# Patient Record
Sex: Female | Born: 1945 | Race: Black or African American | Hispanic: No | Marital: Married
Health system: Southern US, Community
[De-identification: ages and names within clinical notes are randomized; demographics above are authoritative.]

---

## 2012-06-03 ENCOUNTER — Ambulatory Visit: Payer: Self-pay | Admitting: Internal Medicine

## 2012-06-21 ENCOUNTER — Ambulatory Visit: Payer: Self-pay | Admitting: Internal Medicine

## 2012-06-22 ENCOUNTER — Ambulatory Visit: Payer: Self-pay | Admitting: Internal Medicine

## 2012-07-23 ENCOUNTER — Ambulatory Visit: Payer: Self-pay | Admitting: Internal Medicine

## 2013-06-25 ENCOUNTER — Emergency Department: Payer: Self-pay | Admitting: Internal Medicine

## 2015-03-22 ENCOUNTER — Other Ambulatory Visit: Payer: Self-pay | Admitting: Internal Medicine

## 2015-03-22 DIAGNOSIS — Z1231 Encounter for screening mammogram for malignant neoplasm of breast: Secondary | ICD-10-CM

## 2015-04-02 ENCOUNTER — Ambulatory Visit: Payer: Self-pay | Attending: Internal Medicine

## 2015-04-18 ENCOUNTER — Other Ambulatory Visit: Payer: Self-pay | Admitting: Internal Medicine

## 2015-04-18 ENCOUNTER — Ambulatory Visit
Admission: RE | Admit: 2015-04-18 | Discharge: 2015-04-18 | Disposition: A | Discharge status: Home / Self Care | Payer: Medicare Other | Source: Ambulatory Visit | Attending: Internal Medicine | Admitting: Internal Medicine

## 2015-04-18 DIAGNOSIS — Z1231 Encounter for screening mammogram for malignant neoplasm of breast: Secondary | ICD-10-CM | POA: Diagnosis not present

## 2016-05-18 ENCOUNTER — Other Ambulatory Visit: Payer: Self-pay | Admitting: Internal Medicine

## 2016-05-18 DIAGNOSIS — Z1231 Encounter for screening mammogram for malignant neoplasm of breast: Secondary | ICD-10-CM

## 2016-05-28 ENCOUNTER — Ambulatory Visit
Admission: RE | Admit: 2016-05-28 | Discharge: 2016-05-28 | Disposition: A | Discharge status: Home / Self Care | Payer: Medicare Other | Source: Ambulatory Visit | Attending: Internal Medicine | Admitting: Internal Medicine

## 2016-05-28 DIAGNOSIS — Z1231 Encounter for screening mammogram for malignant neoplasm of breast: Secondary | ICD-10-CM | POA: Insufficient documentation

## 2017-05-12 ENCOUNTER — Other Ambulatory Visit: Payer: Self-pay | Admitting: Internal Medicine

## 2017-05-12 DIAGNOSIS — Z1231 Encounter for screening mammogram for malignant neoplasm of breast: Secondary | ICD-10-CM

## 2017-06-04 ENCOUNTER — Ambulatory Visit
Admission: RE | Admit: 2017-06-04 | Discharge: 2017-06-04 | Disposition: A | Discharge status: Home / Self Care | Payer: Medicare HMO | Source: Ambulatory Visit | Attending: Internal Medicine | Admitting: Internal Medicine

## 2017-06-04 DIAGNOSIS — Z1231 Encounter for screening mammogram for malignant neoplasm of breast: Secondary | ICD-10-CM | POA: Diagnosis not present

## 2018-05-04 ENCOUNTER — Other Ambulatory Visit: Payer: Self-pay | Admitting: Internal Medicine

## 2018-05-09 ENCOUNTER — Other Ambulatory Visit: Payer: Self-pay | Admitting: Internal Medicine

## 2018-05-09 DIAGNOSIS — Z1231 Encounter for screening mammogram for malignant neoplasm of breast: Secondary | ICD-10-CM

## 2018-06-06 ENCOUNTER — Ambulatory Visit
Admission: RE | Admit: 2018-06-06 | Discharge: 2018-06-06 | Disposition: A | Discharge status: Home / Self Care | Payer: Medicare HMO | Source: Ambulatory Visit | Attending: Internal Medicine | Admitting: Internal Medicine

## 2018-06-06 DIAGNOSIS — Z1231 Encounter for screening mammogram for malignant neoplasm of breast: Secondary | ICD-10-CM | POA: Diagnosis present

## 2019-05-10 ENCOUNTER — Other Ambulatory Visit: Payer: Self-pay | Admitting: Internal Medicine

## 2019-05-10 DIAGNOSIS — Z1231 Encounter for screening mammogram for malignant neoplasm of breast: Secondary | ICD-10-CM

## 2019-06-08 ENCOUNTER — Ambulatory Visit
Admission: RE | Admit: 2019-06-08 | Discharge: 2019-06-08 | Disposition: A | Discharge status: Home / Self Care | Payer: Medicare HMO | Source: Ambulatory Visit | Attending: Internal Medicine | Admitting: Internal Medicine

## 2019-06-08 DIAGNOSIS — Z1231 Encounter for screening mammogram for malignant neoplasm of breast: Secondary | ICD-10-CM | POA: Insufficient documentation

## 2019-08-04 ENCOUNTER — Ambulatory Visit: Payer: Medicare HMO | Attending: Internal Medicine

## 2019-08-04 DIAGNOSIS — Z23 Encounter for immunization: Secondary | ICD-10-CM

## 2019-08-04 NOTE — Progress Notes (Signed)
   Covid-19 Vaccination Clinic  Name:  SIBEL KHURANA    MRN: [id number] DOB: [birth year]  08/04/2019  Ms. Pavia was observed post Covid-19 immunization for 15 minutes without incidence. She was provided with Vaccine Information Sheet and instruction to access the V-Safe system.   Ms. Yerby was instructed to call 911 with any severe reactions post vaccine: Marland Kitchen Difficulty breathing  . Swelling of your face and throat  . A fast heartbeat  . A bad rash all over your body  . Dizziness and weakness    Immunizations Administered    Name Date Dose VIS Date Route   Pfizer COVID-19 Vaccine 08/04/2019 11:43 AM 0.3 mL 06/02/2019 Intramuscular   Manufacturer: [employer], [employer]   Lot: [id number]   NDC: [address]-3

## 2019-08-29 ENCOUNTER — Ambulatory Visit: Payer: Medicare HMO | Attending: Internal Medicine

## 2019-08-29 DIAGNOSIS — Z23 Encounter for immunization: Secondary | ICD-10-CM | POA: Insufficient documentation

## 2019-08-29 NOTE — Progress Notes (Signed)
   Covid-19 Vaccination Clinic  Name:  ALMIRA PHETTEPLACE    MRN: [id number] DOB: 07/25/1945  08/29/2019  Ms. Hams was observed post Covid-19 immunization for 15 minutes without incident. She was provided with Vaccine Information Sheet and instruction to access the V-Safe system.   Ms. Dunton was instructed to call 911 with any severe reactions post vaccine: Marland Kitchen Difficulty breathing  . Swelling of face and throat  . A fast heartbeat  . A bad rash all over body  . Dizziness and weakness   Immunizations Administered    Name Date Dose VIS Date Route   Pfizer COVID-19 Vaccine 08/29/2019  2:33 PM 0.3 mL 06/02/2019 Intramuscular   Manufacturer: [employer], [employer]   Lot: [id number]   NDC: [address]-8

## 2020-05-22 ENCOUNTER — Other Ambulatory Visit: Payer: Self-pay | Admitting: Internal Medicine

## 2020-05-22 DIAGNOSIS — Z1231 Encounter for screening mammogram for malignant neoplasm of breast: Secondary | ICD-10-CM

## 2020-06-24 DIAGNOSIS — N189 Chronic kidney disease, unspecified: Secondary | ICD-10-CM | POA: Diagnosis not present

## 2020-06-24 DIAGNOSIS — Z0001 Encounter for general adult medical examination with abnormal findings: Secondary | ICD-10-CM | POA: Diagnosis not present

## 2020-06-24 DIAGNOSIS — Z739 Problem related to life management difficulty, unspecified: Secondary | ICD-10-CM | POA: Diagnosis not present

## 2020-06-24 DIAGNOSIS — E785 Hyperlipidemia, unspecified: Secondary | ICD-10-CM | POA: Diagnosis not present

## 2020-06-24 DIAGNOSIS — M158 Other polyosteoarthritis: Secondary | ICD-10-CM | POA: Diagnosis not present

## 2020-06-24 DIAGNOSIS — G4709 Other insomnia: Secondary | ICD-10-CM | POA: Diagnosis not present

## 2020-06-24 DIAGNOSIS — Z1389 Encounter for screening for other disorder: Secondary | ICD-10-CM | POA: Diagnosis not present

## 2020-06-24 DIAGNOSIS — E118 Type 2 diabetes mellitus with unspecified complications: Secondary | ICD-10-CM | POA: Diagnosis not present

## 2020-06-24 DIAGNOSIS — I10 Essential (primary) hypertension: Secondary | ICD-10-CM | POA: Diagnosis not present

## 2020-06-24 DIAGNOSIS — B354 Tinea corporis: Secondary | ICD-10-CM | POA: Diagnosis not present

## 2020-06-24 DIAGNOSIS — R69 Illness, unspecified: Secondary | ICD-10-CM | POA: Diagnosis not present

## 2020-06-24 DIAGNOSIS — Z1331 Encounter for screening for depression: Secondary | ICD-10-CM | POA: Diagnosis not present

## 2020-06-24 DIAGNOSIS — F341 Dysthymic disorder: Secondary | ICD-10-CM | POA: Diagnosis not present

## 2020-08-13 DIAGNOSIS — E119 Type 2 diabetes mellitus without complications: Secondary | ICD-10-CM | POA: Diagnosis not present

## 2020-10-07 DIAGNOSIS — J0100 Acute maxillary sinusitis, unspecified: Secondary | ICD-10-CM | POA: Diagnosis not present

## 2020-10-07 DIAGNOSIS — N189 Chronic kidney disease, unspecified: Secondary | ICD-10-CM | POA: Diagnosis not present

## 2020-10-07 DIAGNOSIS — E118 Type 2 diabetes mellitus with unspecified complications: Secondary | ICD-10-CM | POA: Diagnosis not present

## 2020-10-07 DIAGNOSIS — M158 Other polyosteoarthritis: Secondary | ICD-10-CM | POA: Diagnosis not present

## 2020-10-07 DIAGNOSIS — E668 Other obesity: Secondary | ICD-10-CM | POA: Diagnosis not present

## 2020-10-07 DIAGNOSIS — I10 Essential (primary) hypertension: Secondary | ICD-10-CM | POA: Diagnosis not present

## 2020-10-07 DIAGNOSIS — E785 Hyperlipidemia, unspecified: Secondary | ICD-10-CM | POA: Diagnosis not present

## 2020-10-07 DIAGNOSIS — B354 Tinea corporis: Secondary | ICD-10-CM | POA: Diagnosis not present

## 2020-10-07 DIAGNOSIS — R69 Illness, unspecified: Secondary | ICD-10-CM | POA: Diagnosis not present

## 2020-10-07 DIAGNOSIS — G4709 Other insomnia: Secondary | ICD-10-CM | POA: Diagnosis not present

## 2021-03-06 DIAGNOSIS — E118 Type 2 diabetes mellitus with unspecified complications: Secondary | ICD-10-CM | POA: Diagnosis not present

## 2021-03-06 DIAGNOSIS — E785 Hyperlipidemia, unspecified: Secondary | ICD-10-CM | POA: Diagnosis not present

## 2021-03-06 DIAGNOSIS — I10 Essential (primary) hypertension: Secondary | ICD-10-CM | POA: Diagnosis not present

## 2021-03-07 DIAGNOSIS — N189 Chronic kidney disease, unspecified: Secondary | ICD-10-CM | POA: Diagnosis not present

## 2021-03-07 DIAGNOSIS — E785 Hyperlipidemia, unspecified: Secondary | ICD-10-CM | POA: Diagnosis not present

## 2021-03-07 DIAGNOSIS — G4709 Other insomnia: Secondary | ICD-10-CM | POA: Diagnosis not present

## 2021-03-07 DIAGNOSIS — R69 Illness, unspecified: Secondary | ICD-10-CM | POA: Diagnosis not present

## 2021-03-07 DIAGNOSIS — I10 Essential (primary) hypertension: Secondary | ICD-10-CM | POA: Diagnosis not present

## 2021-03-07 DIAGNOSIS — M158 Other polyosteoarthritis: Secondary | ICD-10-CM | POA: Diagnosis not present

## 2021-03-07 DIAGNOSIS — B354 Tinea corporis: Secondary | ICD-10-CM | POA: Diagnosis not present

## 2021-03-07 DIAGNOSIS — E668 Other obesity: Secondary | ICD-10-CM | POA: Diagnosis not present

## 2021-03-07 DIAGNOSIS — E118 Type 2 diabetes mellitus with unspecified complications: Secondary | ICD-10-CM | POA: Diagnosis not present

## 2021-04-04 DIAGNOSIS — Z23 Encounter for immunization: Secondary | ICD-10-CM | POA: Diagnosis not present

## 2021-05-12 ENCOUNTER — Other Ambulatory Visit: Payer: Self-pay | Admitting: Internal Medicine

## 2021-05-12 DIAGNOSIS — Z1231 Encounter for screening mammogram for malignant neoplasm of breast: Secondary | ICD-10-CM

## 2021-06-03 DIAGNOSIS — E118 Type 2 diabetes mellitus with unspecified complications: Secondary | ICD-10-CM | POA: Diagnosis not present

## 2021-06-03 DIAGNOSIS — N189 Chronic kidney disease, unspecified: Secondary | ICD-10-CM | POA: Diagnosis not present

## 2021-06-06 DIAGNOSIS — B354 Tinea corporis: Secondary | ICD-10-CM | POA: Diagnosis not present

## 2021-06-06 DIAGNOSIS — E118 Type 2 diabetes mellitus with unspecified complications: Secondary | ICD-10-CM | POA: Diagnosis not present

## 2021-06-06 DIAGNOSIS — I10 Essential (primary) hypertension: Secondary | ICD-10-CM | POA: Diagnosis not present

## 2021-06-06 DIAGNOSIS — M158 Other polyosteoarthritis: Secondary | ICD-10-CM | POA: Diagnosis not present

## 2021-06-06 DIAGNOSIS — J0100 Acute maxillary sinusitis, unspecified: Secondary | ICD-10-CM | POA: Diagnosis not present

## 2021-06-06 DIAGNOSIS — E668 Other obesity: Secondary | ICD-10-CM | POA: Diagnosis not present

## 2021-06-06 DIAGNOSIS — N189 Chronic kidney disease, unspecified: Secondary | ICD-10-CM | POA: Diagnosis not present

## 2021-06-06 DIAGNOSIS — G4709 Other insomnia: Secondary | ICD-10-CM | POA: Diagnosis not present

## 2021-06-06 DIAGNOSIS — R69 Illness, unspecified: Secondary | ICD-10-CM | POA: Diagnosis not present

## 2021-06-06 DIAGNOSIS — E785 Hyperlipidemia, unspecified: Secondary | ICD-10-CM | POA: Diagnosis not present

## 2021-09-03 DIAGNOSIS — E118 Type 2 diabetes mellitus with unspecified complications: Secondary | ICD-10-CM | POA: Diagnosis not present

## 2021-09-03 DIAGNOSIS — I10 Essential (primary) hypertension: Secondary | ICD-10-CM | POA: Diagnosis not present

## 2021-09-05 DIAGNOSIS — Z1331 Encounter for screening for depression: Secondary | ICD-10-CM | POA: Diagnosis not present

## 2021-09-05 DIAGNOSIS — E118 Type 2 diabetes mellitus with unspecified complications: Secondary | ICD-10-CM | POA: Diagnosis not present

## 2021-09-05 DIAGNOSIS — M158 Other polyosteoarthritis: Secondary | ICD-10-CM | POA: Diagnosis not present

## 2021-09-05 DIAGNOSIS — N189 Chronic kidney disease, unspecified: Secondary | ICD-10-CM | POA: Diagnosis not present

## 2021-09-05 DIAGNOSIS — F341 Dysthymic disorder: Secondary | ICD-10-CM | POA: Diagnosis not present

## 2021-09-05 DIAGNOSIS — E785 Hyperlipidemia, unspecified: Secondary | ICD-10-CM | POA: Diagnosis not present

## 2021-09-05 DIAGNOSIS — Z0001 Encounter for general adult medical examination with abnormal findings: Secondary | ICD-10-CM | POA: Diagnosis not present

## 2021-09-05 DIAGNOSIS — R69 Illness, unspecified: Secondary | ICD-10-CM | POA: Diagnosis not present

## 2021-09-05 DIAGNOSIS — B354 Tinea corporis: Secondary | ICD-10-CM | POA: Diagnosis not present

## 2021-09-05 DIAGNOSIS — I10 Essential (primary) hypertension: Secondary | ICD-10-CM | POA: Diagnosis not present

## 2021-09-05 DIAGNOSIS — G4709 Other insomnia: Secondary | ICD-10-CM | POA: Diagnosis not present

## 2021-09-05 DIAGNOSIS — E668 Other obesity: Secondary | ICD-10-CM | POA: Diagnosis not present

## 2021-10-31 DIAGNOSIS — E119 Type 2 diabetes mellitus without complications: Secondary | ICD-10-CM | POA: Diagnosis not present

## 2021-10-31 DIAGNOSIS — H35341 Macular cyst, hole, or pseudohole, right eye: Secondary | ICD-10-CM | POA: Diagnosis not present

## 2021-10-31 DIAGNOSIS — H35371 Puckering of macula, right eye: Secondary | ICD-10-CM | POA: Diagnosis not present

## 2021-10-31 DIAGNOSIS — H43813 Vitreous degeneration, bilateral: Secondary | ICD-10-CM | POA: Diagnosis not present

## 2021-11-14 ENCOUNTER — Other Ambulatory Visit: Payer: Self-pay | Admitting: Internal Medicine

## 2021-11-14 DIAGNOSIS — Z1231 Encounter for screening mammogram for malignant neoplasm of breast: Secondary | ICD-10-CM

## 2021-11-28 ENCOUNTER — Ambulatory Visit
Admission: RE | Admit: 2021-11-28 | Discharge: 2021-11-28 | Disposition: A | Discharge status: Home / Self Care | Payer: Medicare HMO | Source: Ambulatory Visit | Attending: Internal Medicine | Admitting: Internal Medicine

## 2021-11-28 DIAGNOSIS — Z1231 Encounter for screening mammogram for malignant neoplasm of breast: Secondary | ICD-10-CM | POA: Insufficient documentation

## 2021-12-01 DIAGNOSIS — H5213 Myopia, bilateral: Secondary | ICD-10-CM | POA: Diagnosis not present

## 2021-12-09 DIAGNOSIS — E118 Type 2 diabetes mellitus with unspecified complications: Secondary | ICD-10-CM | POA: Diagnosis not present

## 2021-12-09 DIAGNOSIS — N189 Chronic kidney disease, unspecified: Secondary | ICD-10-CM | POA: Diagnosis not present

## 2021-12-12 DIAGNOSIS — G4709 Other insomnia: Secondary | ICD-10-CM | POA: Diagnosis not present

## 2021-12-12 DIAGNOSIS — N189 Chronic kidney disease, unspecified: Secondary | ICD-10-CM | POA: Diagnosis not present

## 2021-12-12 DIAGNOSIS — E118 Type 2 diabetes mellitus with unspecified complications: Secondary | ICD-10-CM | POA: Diagnosis not present

## 2021-12-12 DIAGNOSIS — E668 Other obesity: Secondary | ICD-10-CM | POA: Diagnosis not present

## 2021-12-12 DIAGNOSIS — E785 Hyperlipidemia, unspecified: Secondary | ICD-10-CM | POA: Diagnosis not present

## 2021-12-12 DIAGNOSIS — B354 Tinea corporis: Secondary | ICD-10-CM | POA: Diagnosis not present

## 2021-12-12 DIAGNOSIS — I10 Essential (primary) hypertension: Secondary | ICD-10-CM | POA: Diagnosis not present

## 2021-12-12 DIAGNOSIS — M158 Other polyosteoarthritis: Secondary | ICD-10-CM | POA: Diagnosis not present

## 2021-12-12 DIAGNOSIS — R69 Illness, unspecified: Secondary | ICD-10-CM | POA: Diagnosis not present

## 2021-12-15 DIAGNOSIS — H6123 Impacted cerumen, bilateral: Secondary | ICD-10-CM | POA: Diagnosis not present

## 2021-12-15 DIAGNOSIS — H903 Sensorineural hearing loss, bilateral: Secondary | ICD-10-CM | POA: Diagnosis not present

## 2022-01-12 DIAGNOSIS — K08409 Partial loss of teeth, unspecified cause, unspecified class: Secondary | ICD-10-CM | POA: Diagnosis not present

## 2022-01-12 DIAGNOSIS — E785 Hyperlipidemia, unspecified: Secondary | ICD-10-CM | POA: Diagnosis not present

## 2022-01-12 DIAGNOSIS — N182 Chronic kidney disease, stage 2 (mild): Secondary | ICD-10-CM | POA: Diagnosis not present

## 2022-01-12 DIAGNOSIS — E1122 Type 2 diabetes mellitus with diabetic chronic kidney disease: Secondary | ICD-10-CM | POA: Diagnosis not present

## 2022-01-12 DIAGNOSIS — K219 Gastro-esophageal reflux disease without esophagitis: Secondary | ICD-10-CM | POA: Diagnosis not present

## 2022-01-12 DIAGNOSIS — E669 Obesity, unspecified: Secondary | ICD-10-CM | POA: Diagnosis not present

## 2022-01-12 DIAGNOSIS — I10 Essential (primary) hypertension: Secondary | ICD-10-CM | POA: Diagnosis not present

## 2022-01-12 DIAGNOSIS — R69 Illness, unspecified: Secondary | ICD-10-CM | POA: Diagnosis not present

## 2022-01-12 DIAGNOSIS — F419 Anxiety disorder, unspecified: Secondary | ICD-10-CM | POA: Diagnosis not present

## 2022-01-12 DIAGNOSIS — J309 Allergic rhinitis, unspecified: Secondary | ICD-10-CM | POA: Diagnosis not present

## 2022-01-12 DIAGNOSIS — E1136 Type 2 diabetes mellitus with diabetic cataract: Secondary | ICD-10-CM | POA: Diagnosis not present

## 2022-01-12 DIAGNOSIS — M1990 Unspecified osteoarthritis, unspecified site: Secondary | ICD-10-CM | POA: Diagnosis not present

## 2022-11-25 ENCOUNTER — Other Ambulatory Visit: Payer: Self-pay | Admitting: Internal Medicine

## 2023-02-10 IMAGING — MG MM DIGITAL SCREENING BILAT W/ TOMO AND CAD
6 of 12 series · 6 of 36 positions shown · non-contrast
Comparison: Previous exam(s).

CLINICAL DATA: Screening.

EXAM:
DIGITAL SCREENING BILATERAL MAMMOGRAM WITH TOMOSYNTHESIS AND CAD
TECHNIQUE: Bilateral screening digital craniocaudal and mediolateral oblique
mammograms were obtained. Bilateral screening digital breast
tomosynthesis was performed. The images were evaluated with
computer-aided detection.

[L MLO synth-2D (1 of 2)]
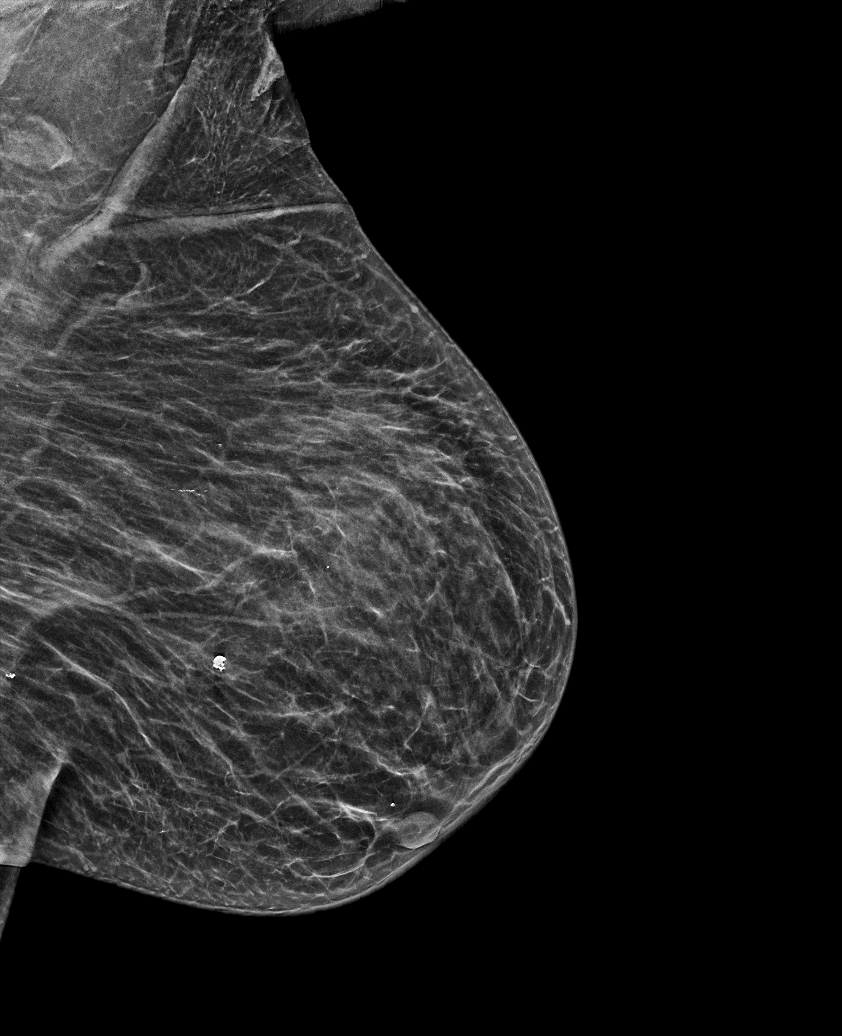

[L CC synth-2D]
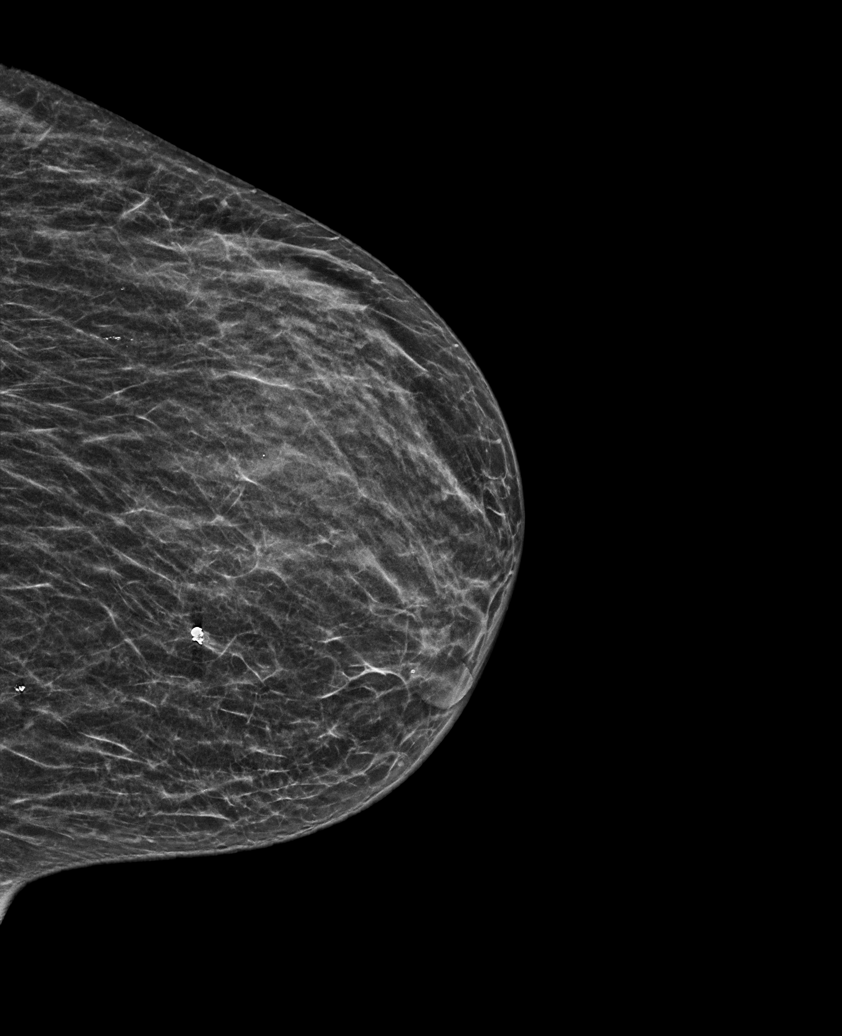

[L MLO synth-2D (2 of 2)]
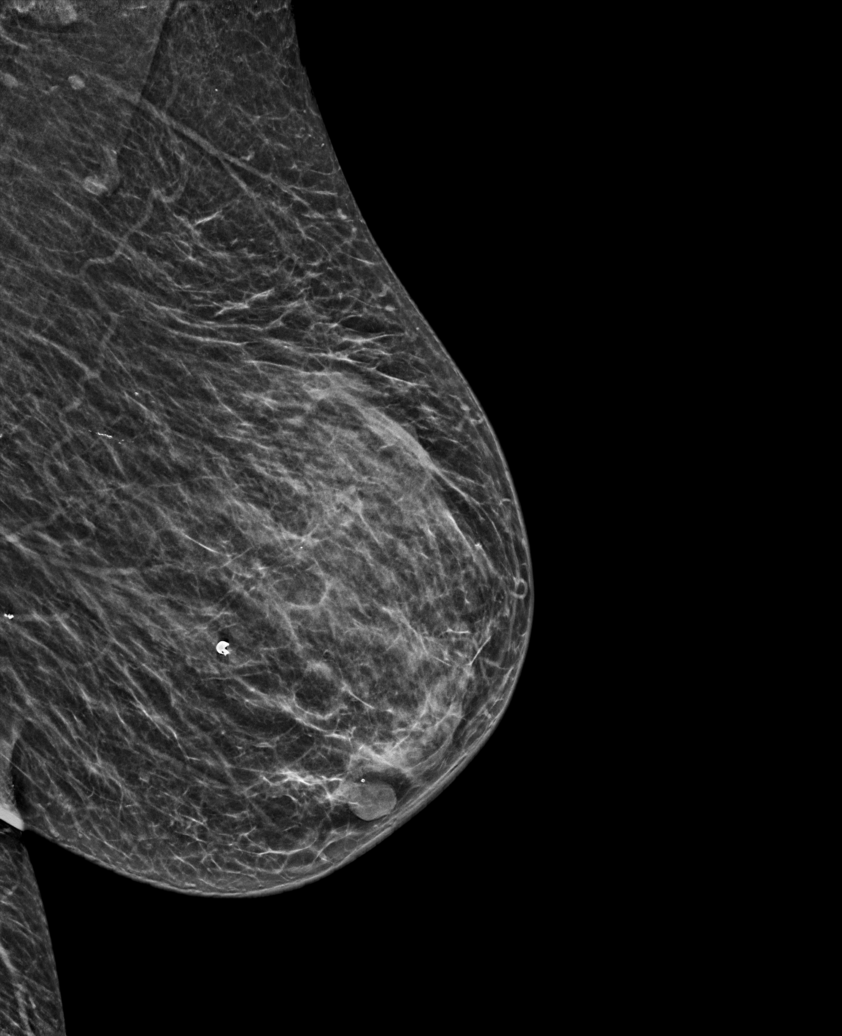

[R MLO synth-2D (1 of 2)]
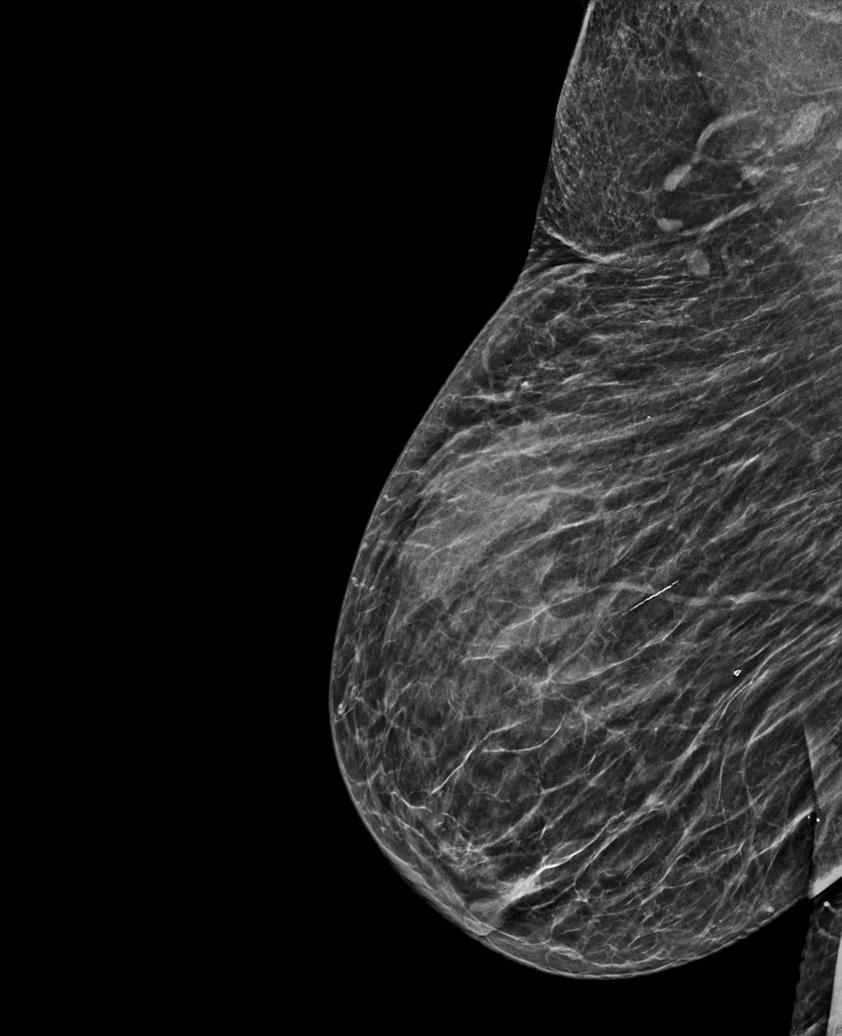

[R CC synth-2D]
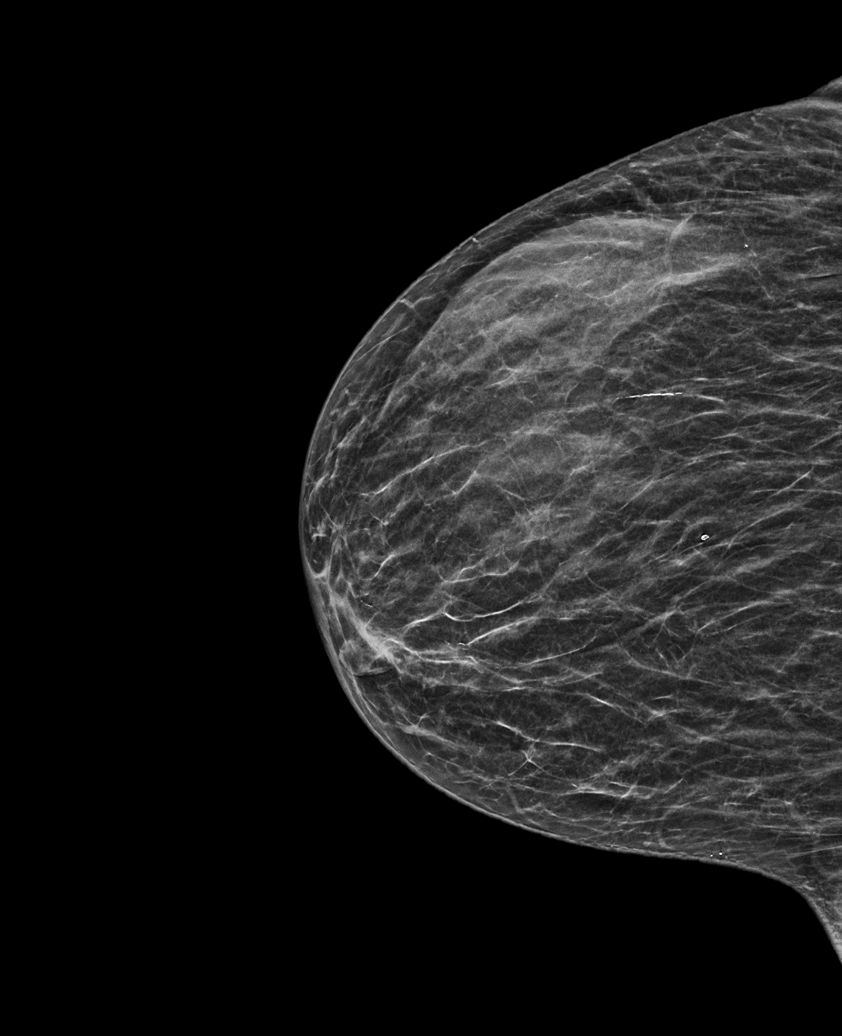

[R MLO synth-2D (2 of 2)]
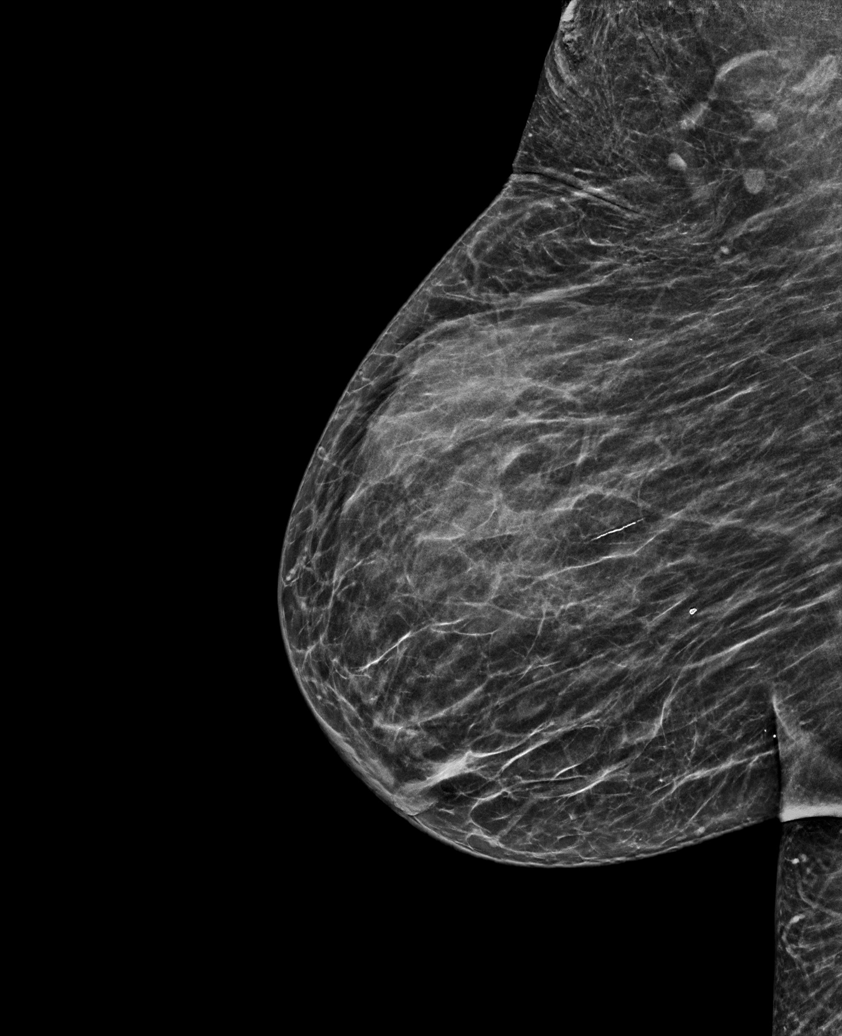

[6 of 36 positions shown; findings below may reference images not displayed]

ACR Breast Density Category c: The breast tissue is heterogeneously
dense, which may obscure small masses.
FINDINGS: There are no findings suspicious for malignancy.
IMPRESSION: No mammographic evidence of malignancy. A result letter of this
screening mammogram will be mailed directly to the patient.

RECOMMENDATION:
Screening mammogram in one year. (Code:[id number])

BI-RADS CATEGORY  1: Negative.
